# Patient Record
Sex: Female | Born: 2000 | Race: White | Hispanic: No | Marital: Single | Smoking: Never smoker
Health system: Southern US, Community
[De-identification: ages and names within clinical notes are randomized; demographics above are authoritative.]

---
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
## 2020-07-03 IMAGING — CR CHEST - 2 VIEW
2 series · 2 of 2 positions shown · non-contrast
Comparison: None.

CLINICAL DATA: Syncope

EXAM:
CHEST - 2 VIEW

[chest pa]
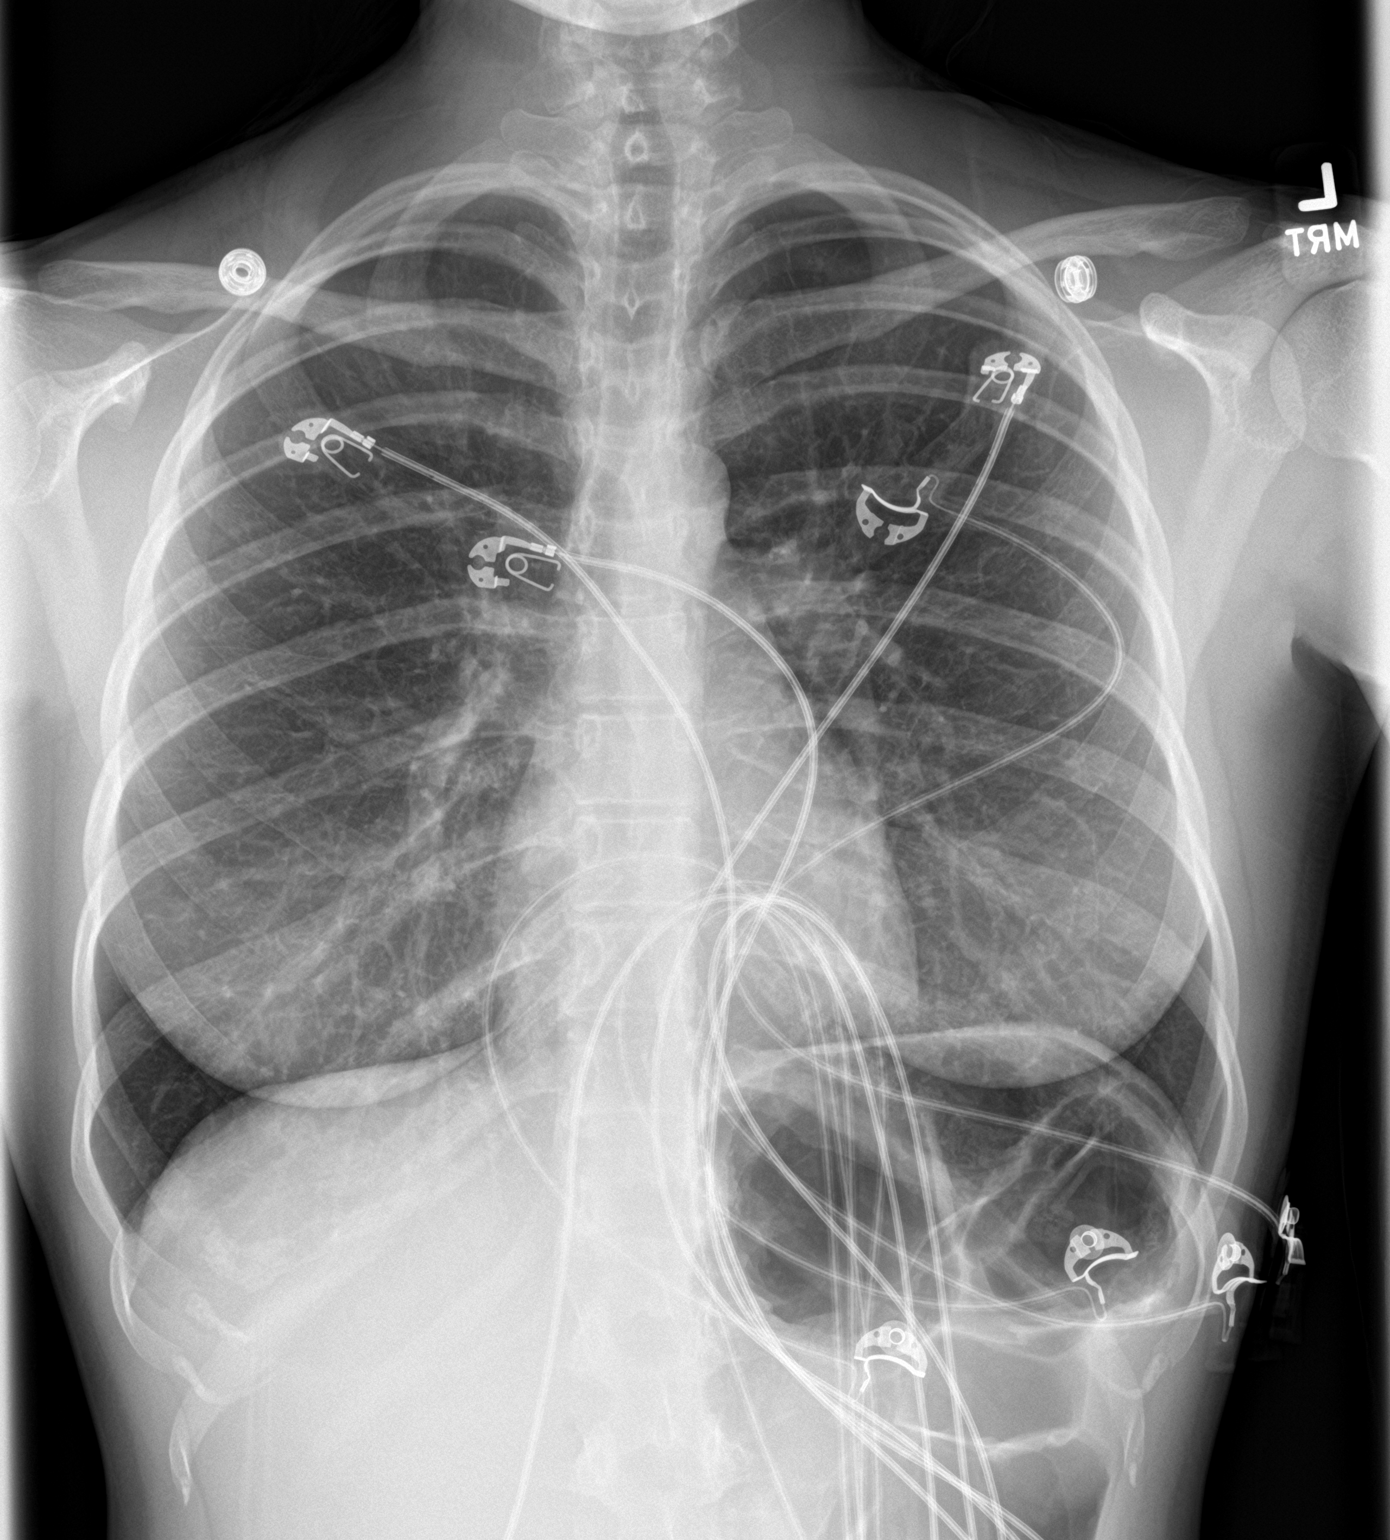

[chest lat]
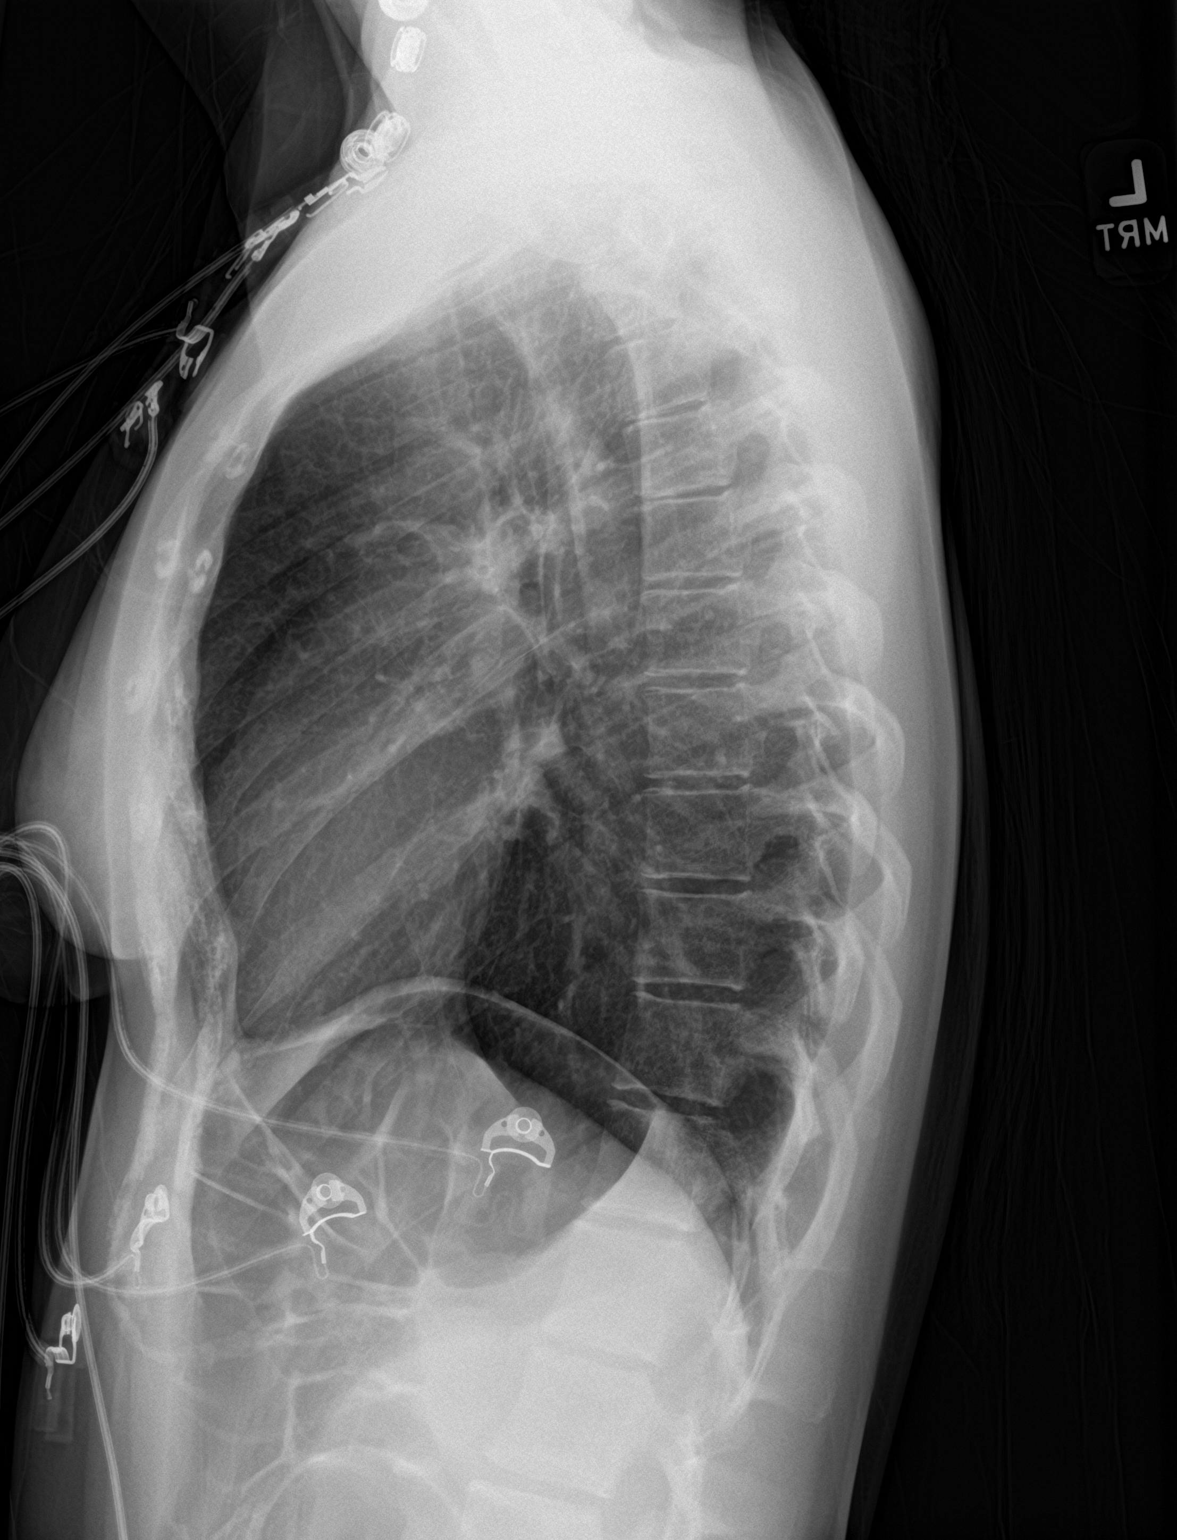

[2 of 2 positions shown; findings below may reference images not displayed]

FINDINGS: Lungs are clear. Heart size and pulmonary vascularity are normal. No
adenopathy. No pneumothorax. No bone lesions. Visualized upper
abdominal loops of bowel appear mildly prominent.
IMPRESSION: No edema or consolidation. Cardiac silhouette within normal limits.
Question a degree of bowel ileus.

## 2020-07-03 IMAGING — CT CT HEAD WITHOUT CONTRAST
4 series · 16 of 47 positions shown, 18 images · non-contrast
Comparison: None.

CLINICAL DATA: Syncopal episode with trauma to the right side of
the head.

EXAM:
CT HEAD WITHOUT CONTRAST
TECHNIQUE: Contiguous axial images were obtained from the base of the skull
through the vertex without intravenous contrast.

[Series 3: head without · axial · non-contrast · 0.42mm/px · z∈[-101,+19]mm · 7 of 32 slices shown, 9 images]
[im 4/32  brain]
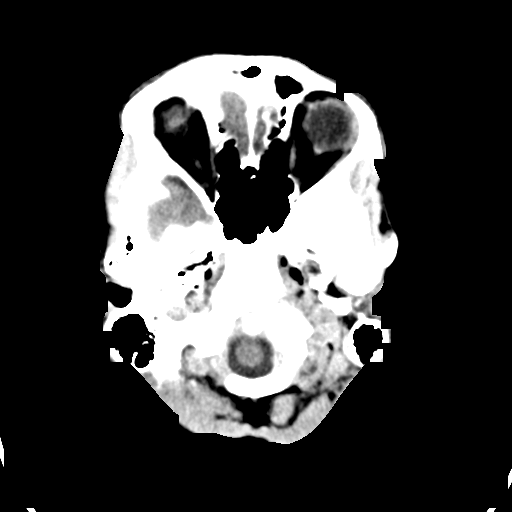
[im 4/32  bone]
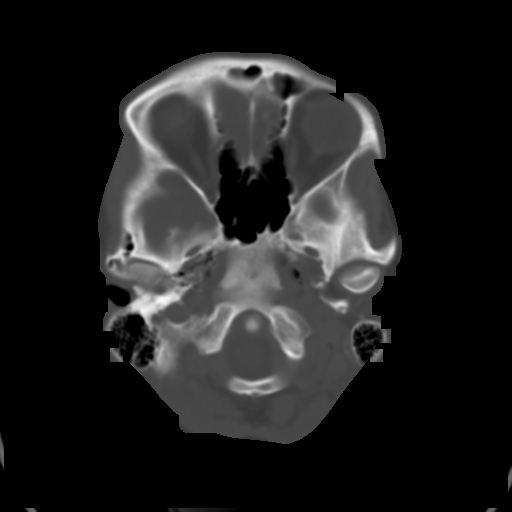
[im 8/32  brain]
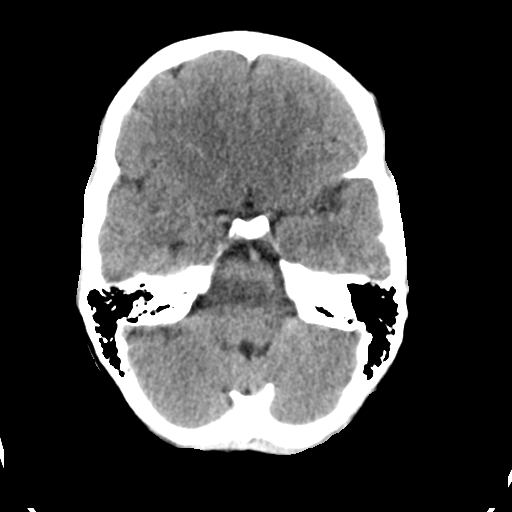
[im 12/32  brain]
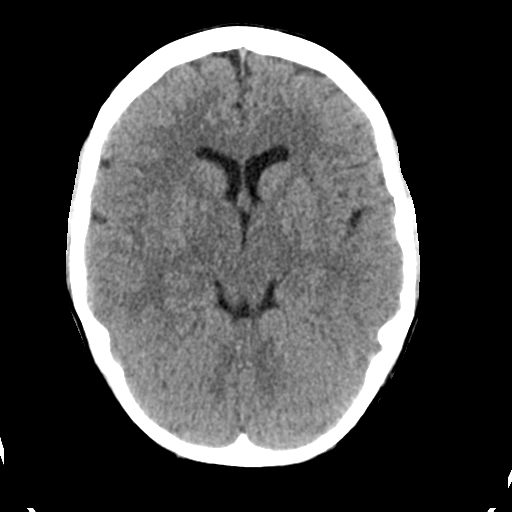
[im 16/32  brain]
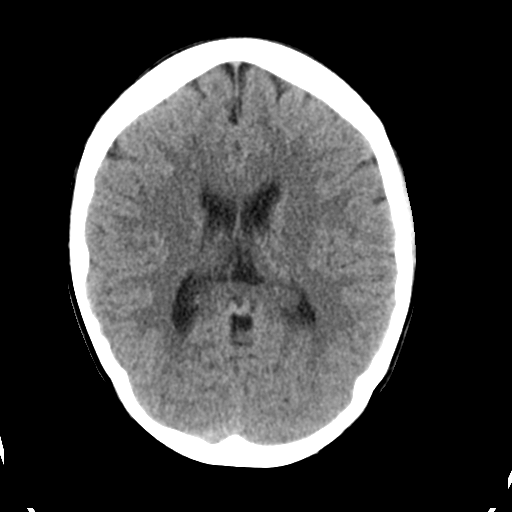
[im 20/32  brain]
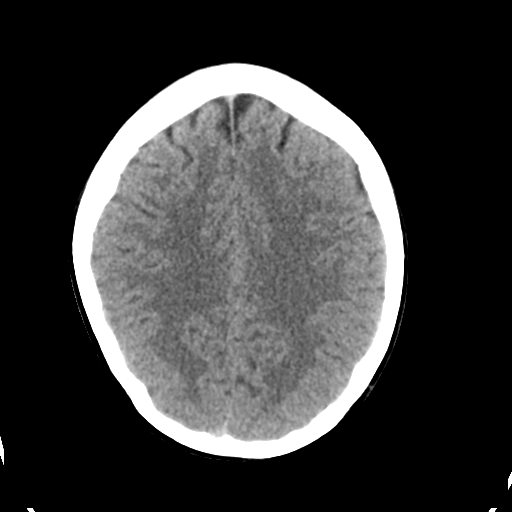
[im 20/32  bone]
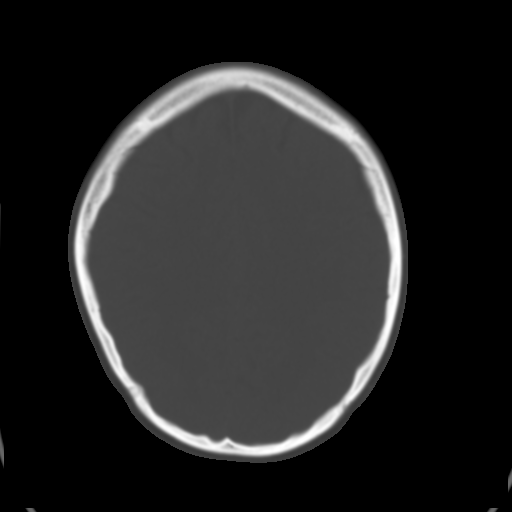
[im 24/32  brain]
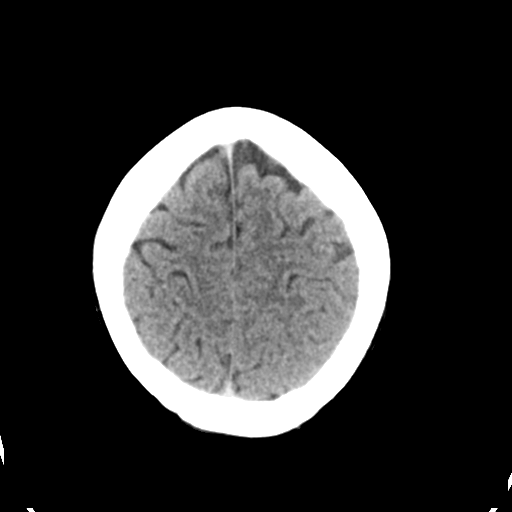
[im 28/32  brain]
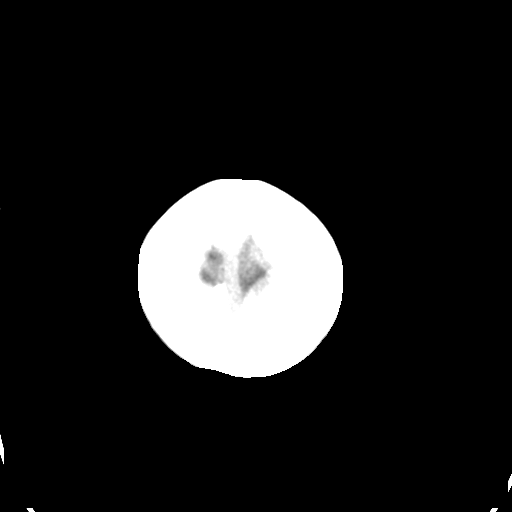

[Series 4: head bone · axial · 0.42mm/px · z∈[-102,-70]mm · 3 of 80 slices shown]
[im 8/80  bone]
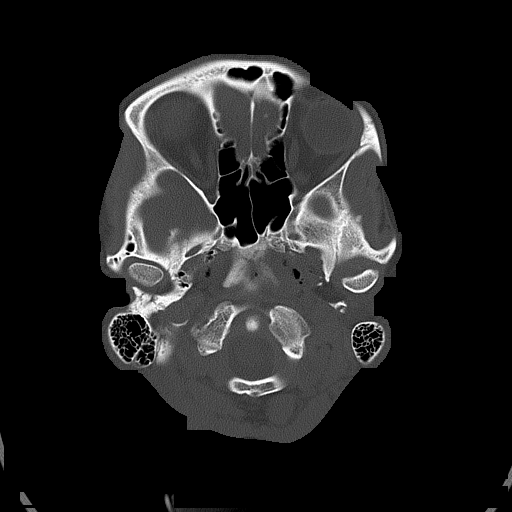
[im 16/80  bone]
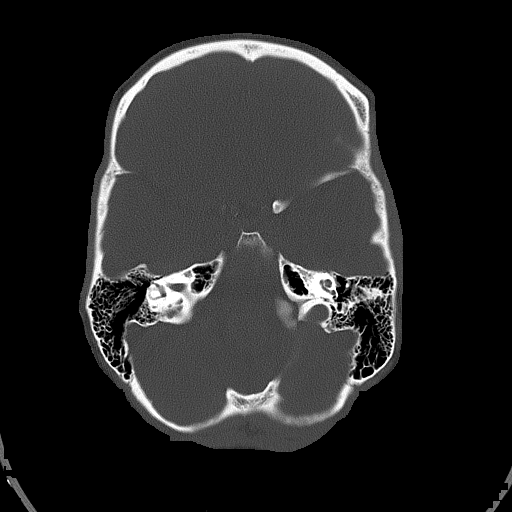
[im 24/80  bone]
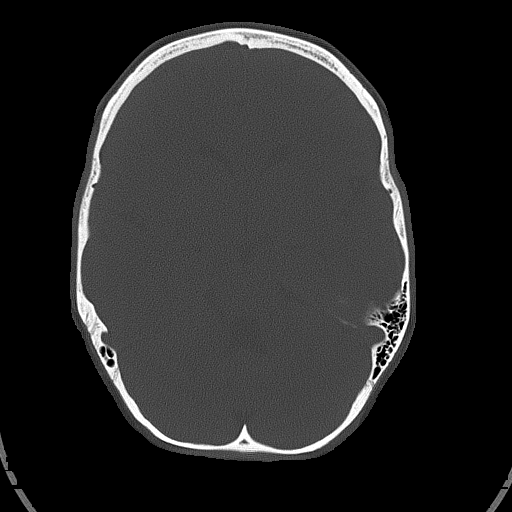

[Series 5: head without cor · coronal · non-contrast · 0.32mm/px · 3 of 69 slices shown]
[im 23/69  brain]
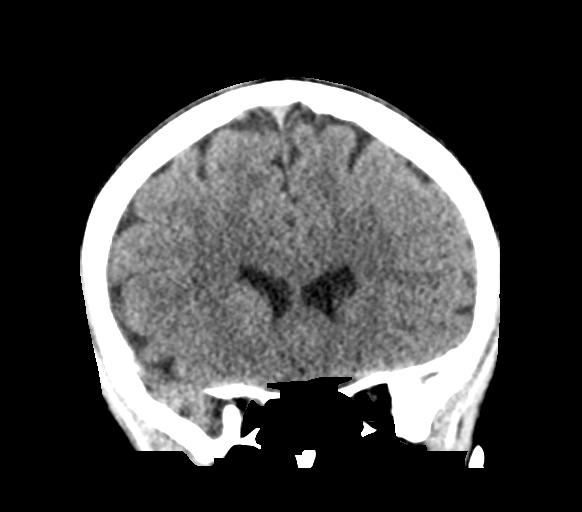
[im 31/69  brain]
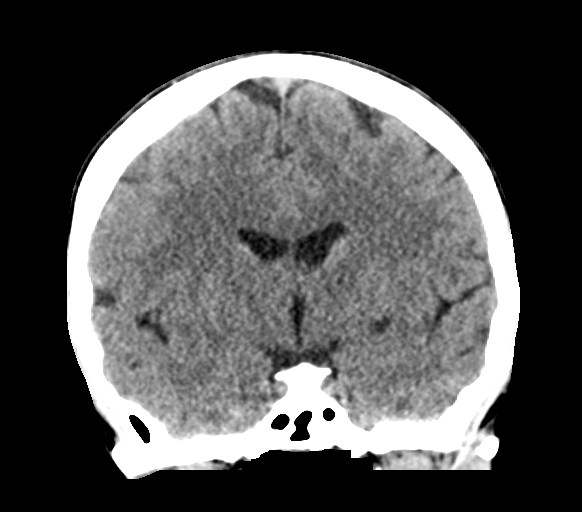
[im 38/69  brain]
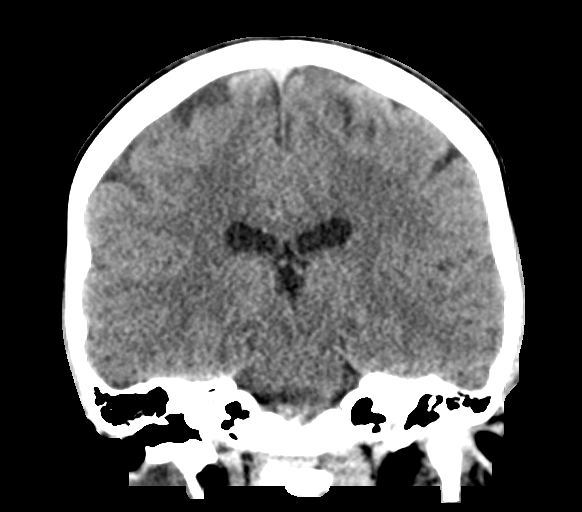

[Series 6: head without sag · sagittal · non-contrast · 0.33mm/px · 3 of 57 slices shown]
[im 19/57  brain]
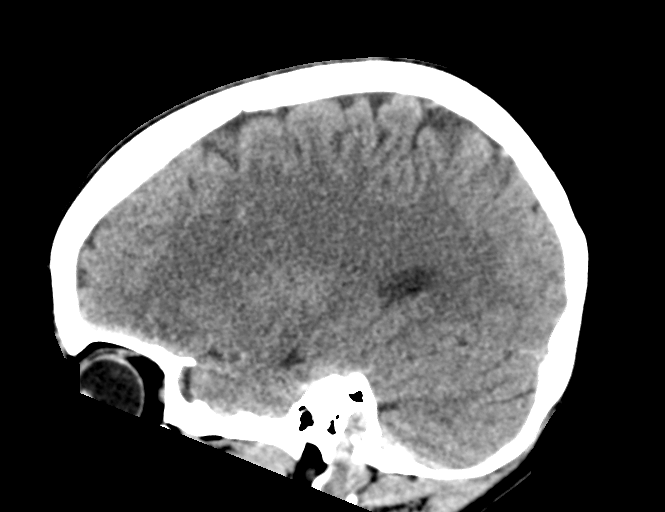
[im 29/57  brain]
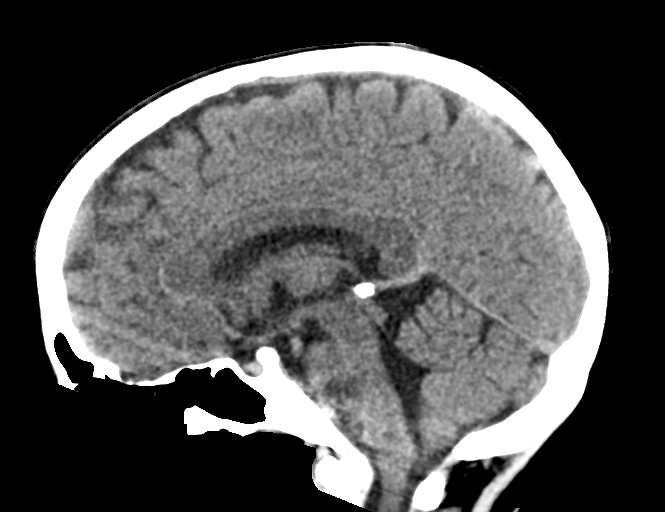
[im 38/57  brain]
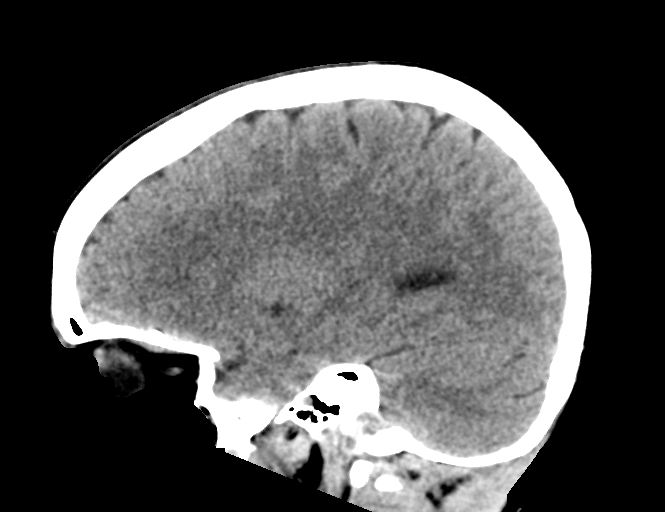

[16 of 47 positions shown; findings below may reference images not displayed]

FINDINGS: Brain: The brain shows a normal appearance without evidence of
malformation, atrophy, old or acute small or large vessel
infarction, mass lesion, hemorrhage, hydrocephalus or extra-axial
collection.

Vascular: No hyperdense vessel.

Skull: Normal.  No traumatic finding.  No focal bone lesion.

Sinuses/Orbits: Sinuses are clear. Orbits appear normal. Mastoids
are clear.

Other: None significant
IMPRESSION: Normal head CT
